# Patient Record
Sex: Female | Born: 1970 | Race: Black or African American | Hispanic: No | Marital: Married | Smoking: Former smoker
Health system: Southern US, Community
[De-identification: ages and names within clinical notes are randomized; demographics above are authoritative.]

## PROBLEM LIST (undated history)

## (undated) DIAGNOSIS — I10 Essential (primary) hypertension: Secondary | ICD-10-CM

---

## 2002-04-07 ENCOUNTER — Emergency Department (HOSPITAL_COMMUNITY): Admission: EM | Admit: 2002-04-07 | Discharge: 2002-04-07 | Payer: Self-pay | Admitting: Emergency Medicine

## 2002-04-07 ENCOUNTER — Encounter: Payer: Self-pay | Admitting: *Deleted

## 2002-05-03 ENCOUNTER — Emergency Department (HOSPITAL_COMMUNITY): Admission: EM | Admit: 2002-05-03 | Discharge: 2002-05-03 | Payer: Self-pay | Admitting: *Deleted

## 2002-05-03 ENCOUNTER — Encounter: Payer: Self-pay | Admitting: *Deleted

## 2007-04-14 ENCOUNTER — Other Ambulatory Visit: Admission: RE | Admit: 2007-04-14 | Discharge: 2007-04-14 | Payer: Self-pay | Admitting: Obstetrics and Gynecology

## 2007-09-12 ENCOUNTER — Encounter (INDEPENDENT_AMBULATORY_CARE_PROVIDER_SITE_OTHER): Payer: Self-pay | Admitting: Gynecology

## 2007-09-12 ENCOUNTER — Ambulatory Visit: Payer: Self-pay | Admitting: Gynecology

## 2007-09-12 ENCOUNTER — Inpatient Hospital Stay (HOSPITAL_COMMUNITY): Admission: AD | Admit: 2007-09-12 | Discharge: 2007-09-14 | Payer: Self-pay | Admitting: Gynecology

## 2010-07-02 NOTE — Op Note (Signed)
Terry [name], Terry [name]              ACCOUNT NO.:  [id number]   MEDICAL RECORD NO.:  [id number]          PATIENT TYPE:  INP   LOCATION:  9123                          FACILITY:  [facility]   PHYSICIAN:  Ginger Carne, MD  DATE OF BIRTH:  [birth year]   DATE OF PROCEDURE:  DATE OF DISCHARGE:                               OPERATIVE REPORT   PREOPERATIVE DIAGNOSES:  1. Repeat cesarean section.  2. Premature rupture of membranes at 37-4/7th weeks' gestational age.  3. Sterilization.   POSTOPERATIVE DIAGNOSES:  1. Repeat cesarean section.  2. Premature rupture of membranes at 37-4/7th weeks' gestational age.  3. Sterilization.  4. Term viable delivery of female infant.   PROCEDURE:  Repeat low-transverse cesarean section and Pomeroy bilateral  tubal ligation.   SURGEON:  Ginger Carne, MD   ASSISTANT:  None.   COMPLICATIONS:  None immediate.   ESTIMATED BLOOD LOSS:  750 mL.   ANESTHESIA:  Spinal.   SPECIMEN:  Cord blood.   OPERATIVE FINDINGS:  Term infant female delivered in a vertex  presentation.  Apgars and weight per delivery room record.  No gross  abnormalities.  Baby cried spontaneously at delivery.  Amniotic fluid  was clear, non-foul smelling.  Uterus, tubes, and ovaries showed normal  decidual changes of pregnancy.  Three-vessel cord central insertion,  complete placenta. Apgar and weight per delivery room record.   OPERATIVE PROCEDURE:  The patient was prepped and draped in usual  fashion and placed in the left lateral supine position.  Betadine  solution used for antiseptic and the patient was catheterized prior to  procedure.  After adequate spinal analgesia, a Pfannenstiel incision was  made and the abdomen opened.  Lower uterine segment incised transversely  after developing the bladder flap.  Baby delivered, cord clamped and  cut, and infant given to the pediatric staff after bulb suctioning.  Placenta removed manually.  Uterus inspected.  Closure of the  uterine  musculature in 1 layer with 2-0 Vicryl running interlocking suture.  Bleeding points hemostatically checked.  Blood clots removed.   Pomeroy bilateral tubal ligation performed by grasping the isthmus  ampullary portion of the tube on either side.  Both tubes were  identified and separated apart from their respective round ligaments.  Approximately 2-3 cm of tube were tied between two 2-0 plain catgut  sutures to form a loop on either side.  Tubes cut above said knots and  tips cauterized.  No active bleeding noted.  Bleeding points  hemostatically checked.  Blood clots removed.  Closure of the fascia  with double-loop 0-PDS running suture, 0-Vicryl for the subcutaneous  layer, and skin staples for the skin.  Instruments and sponge counts  were correct.  The patient tolerated the procedure well and returned to  post anesthesia recovery room in excellent condition.      Ginger Carne, MD  Electronically Signed     SHB/MEDQ  D:  09/12/2007  T:  09/12/2007  Job:  [id number]

## 2010-07-02 NOTE — Discharge Summary (Signed)
[name], [name]              ACCOUNT NO.:  [id number]   MEDICAL RECORD NO.:  [id number]          PATIENT TYPE:  INP   LOCATION:  9123                          FACILITY:  [facility]   PHYSICIAN:  Ginger Carne, MD  DATE OF BIRTH:  01/29/1971   DATE OF ADMISSION:  09/12/2007  DATE OF DISCHARGE:                               DISCHARGE SUMMARY   REASON FOR ADMISSION:  Pregnancy at 37 weeks and 4 days and labor for a  repeat cesarean section and bilateral tubal ligation.   DISCHARGE DIAGNOSES:  Repeat low transverse cesarean section and  bilateral Pomeroy tubal ligation.   HOSPITAL COURSE:  The patient's hospital course has been uneventful.  She has been up, ambulating well, taking p.o. fluids and solids without  difficulties and desires discharge home on postop day #2.   LABORATORY DATA:  Hemoglobin is 10.3 today, hematocrit 30.1, and  platelets are 178.   DISCHARGE MEDICATIONS:  1. Motrin 600 one p.o. q.6 h. p.r.n. cramping.  2. Percocet 5/325 one p.o. q.4 h. p.r.n. pain.  3. HCTZ 25 mg p.o. daily x3.  4. Chromagen Forte 1 p.o. b.i.d.   PHYSICAL EXAM:  Today, vital signs are stable.  Heart has regular rhythm  and rate.  Lungs are clear to auscultation bilaterally.  Abdomen is  soft.  Bowel sounds are present x4.  Incision is dry and intact.  No  redness, swelling, or discharge.  Lochia is scant amount.  There is 1+  pitting edema in the lower extremities.   IMPRESSION:  Stable postoperative day #2.   PLAN:  We are going to discharge her home to follow up on Thursday at  [facility] for staple removal.  She is to notify [facility] if any  problems or she can report back here to the emergency room.      Zerita Boers, N.M.      Ginger Carne, MD  Electronically Signed   DL/MEDQ  D:  04/54/0981  T:  09/14/2007  Job:  [id number]   cc:   Tilda Burrow, M.D.  Fax: [phone]

## 2010-11-15 LAB — CBC
HCT: 30.1 — ABNORMAL LOW
HCT: 38.8
Hemoglobin: 10.3 — ABNORMAL LOW
Hemoglobin: 13.1
MCHC: 33.7
MCV: 95.7
Platelets: 217
RBC: 3.15 — ABNORMAL LOW
RBC: 4.06
RDW: 14.5
RDW: 14.7
WBC: 8.2

## 2010-11-15 LAB — BASIC METABOLIC PANEL
BUN: 3 — ABNORMAL LOW
CO2: 24
Calcium: 8.1 — ABNORMAL LOW
Chloride: 107
Creatinine, Ser: 0.42
GFR calc Af Amer: >60
GFR calc non Af Amer: >60
Glucose, Bld: 103 — ABNORMAL HIGH
Potassium: 3.4 — ABNORMAL LOW
Sodium: 138

## 2010-11-15 LAB — RPR: RPR Ser Ql: NONREACTIVE

## 2012-02-18 ENCOUNTER — Encounter (HOSPITAL_COMMUNITY): Payer: Self-pay

## 2012-02-18 ENCOUNTER — Emergency Department (HOSPITAL_COMMUNITY)
Admission: EM | Admit: 2012-02-18 | Discharge: 2012-02-18 | Disposition: A | Discharge status: Home / Self Care | Payer: BC Managed Care – PPO | Attending: Emergency Medicine | Admitting: Emergency Medicine

## 2012-02-18 DIAGNOSIS — IMO0001 Reserved for inherently not codable concepts without codable children: Secondary | ICD-10-CM | POA: Insufficient documentation

## 2012-02-18 DIAGNOSIS — F17200 Nicotine dependence, unspecified, uncomplicated: Secondary | ICD-10-CM | POA: Insufficient documentation

## 2012-02-18 DIAGNOSIS — I10 Essential (primary) hypertension: Secondary | ICD-10-CM | POA: Insufficient documentation

## 2012-02-18 DIAGNOSIS — R6883 Chills (without fever): Secondary | ICD-10-CM | POA: Insufficient documentation

## 2012-02-18 DIAGNOSIS — J111 Influenza due to unidentified influenza virus with other respiratory manifestations: Secondary | ICD-10-CM | POA: Insufficient documentation

## 2012-02-18 HISTORY — DX: Essential (primary) hypertension: I10

## 2012-02-18 MED ORDER — OSELTAMIVIR PHOSPHATE 75 MG PO CAPS: 75.0000 mg | ORAL_CAPSULE | Freq: Two times a day (BID) | ORAL | Status: DC

## 2012-02-18 NOTE — ED Notes (Signed)
Patient with no complaints at this time. Respirations even and unlabored. Skin warm/dry. Discharge instructions reviewed with patient at this time. Patient given opportunity to voice concerns/ask questions. Patient discharged at this time and left Emergency Department with steady gait.   

## 2012-02-18 NOTE — ED Notes (Signed)
Pt reports cough and congestion since yesterday.

## 2012-02-18 NOTE — ED Provider Notes (Signed)
History     CSN: [id number]  Arrival date & time 02/18/12  1151   First MD Initiated Contact with Patient 02/18/12 1217      Chief Complaint  Patient presents with  . Cough    (Consider location/radiation/quality/duration/timing/severity/associated sxs/prior treatment) HPI Comments: Close family member dx with H1N1 flu 2 days ago.  Patient is a 42 y.o. female presenting with cough. The history is provided by the patient. No language interpreter was used.  Cough This is a new problem. The current episode started yesterday. The problem occurs every few minutes. The problem has not changed since onset.The cough is productive of sputum. There has been no fever. Associated symptoms include chills and myalgias. Pertinent negatives include no headaches, no sore throat, no shortness of breath and no wheezing. She has tried nothing for the symptoms. She is not a smoker. Her past medical history does not include bronchitis, pneumonia, COPD, emphysema or asthma.    Past Medical History  Diagnosis Date  . Hypertension     Past Surgical History  Procedure Date  . Cesarean section     No family history on file.  History  Substance Use Topics  . Smoking status: Current Every Day Smoker  . Smokeless tobacco: Not on file  . Alcohol Use: No    OB History    Grav Para Term Preterm Abortions TAB SAB Ect Mult Living                  Review of Systems  Constitutional: Positive for chills. Negative for fever.  HENT: Negative for sore throat.   Respiratory: Positive for cough. Negative for shortness of breath and wheezing.   Musculoskeletal: Positive for myalgias.  Neurological: Negative for headaches.    Allergies  Review of patient's allergies indicates no known allergies.  Home Medications   Current Outpatient Rx  Name  Route  Sig  Dispense  Refill  . OSELTAMIVIR PHOSPHATE 75 MG PO CAPS   Oral   Take 1 capsule (75 mg total) by mouth every 12 (twelve) hours.   10 capsule  0     BP 192/93  Pulse 83  Temp 97.9 F (36.6 C) (Oral)  Resp 20  Ht 5\' 2"  (1.575 m)  Wt 215 lb (97.523 kg)  BMI 39.32 kg/m2  SpO2 97%  Physical Exam  Nursing note and vitals reviewed. Constitutional: She is oriented to person, place, and time. She appears well-developed and well-nourished. No distress.  HENT:  Head: Normocephalic and atraumatic.  Eyes: EOM are normal.  Neck: Normal range of motion.  Cardiovascular: Normal rate and regular rhythm.   Pulmonary/Chest: Effort normal and breath sounds normal. No accessory muscle usage. Not tachypneic. No respiratory distress. She has no decreased breath sounds. She has no wheezes. She has no rhonchi. She has no rales. She exhibits no tenderness.  Abdominal: Soft. She exhibits no distension. There is no tenderness.  Musculoskeletal: Normal range of motion.  Neurological: She is alert and oriented to person, place, and time.  Skin: Skin is warm and dry.  Psychiatric: She has a normal mood and affect. Judgment normal.    ED Course  Procedures (including critical care time)  Labs Reviewed - No data to display No results found.   1. Influenza       MDM  rx-tamiflu 75 mg, 10 Tylenol or ibuprofen Fluids, rest F/u with PCP prn        Evalina Field, PA 02/18/12 1316  Evalina Field, [place] 02/24/12 2242

## 2012-02-25 NOTE — ED Provider Notes (Signed)
Medical screening examination/treatment/procedure(s) were performed by non-physician practitioner and as supervising physician I was immediately available for consultation/collaboration.   Shelda Jakes, MD 02/25/12 2352

## 2013-09-02 ENCOUNTER — Encounter (HOSPITAL_COMMUNITY): Payer: Self-pay | Admitting: Emergency Medicine

## 2013-09-02 ENCOUNTER — Emergency Department (HOSPITAL_COMMUNITY)
Admission: EM | Admit: 2013-09-02 | Discharge: 2013-09-02 | Disposition: A | Discharge status: Home / Self Care | Payer: BC Managed Care – PPO | Attending: Emergency Medicine | Admitting: Emergency Medicine

## 2013-09-02 DIAGNOSIS — I10 Essential (primary) hypertension: Secondary | ICD-10-CM | POA: Insufficient documentation

## 2013-09-02 DIAGNOSIS — S161XXA Strain of muscle, fascia and tendon at neck level, initial encounter: Secondary | ICD-10-CM

## 2013-09-02 DIAGNOSIS — Z79899 Other long term (current) drug therapy: Secondary | ICD-10-CM | POA: Insufficient documentation

## 2013-09-02 DIAGNOSIS — Y92410 Unspecified street and highway as the place of occurrence of the external cause: Secondary | ICD-10-CM | POA: Insufficient documentation

## 2013-09-02 DIAGNOSIS — IMO0002 Reserved for concepts with insufficient information to code with codable children: Secondary | ICD-10-CM | POA: Insufficient documentation

## 2013-09-02 DIAGNOSIS — S139XXA Sprain of joints and ligaments of unspecified parts of neck, initial encounter: Secondary | ICD-10-CM | POA: Insufficient documentation

## 2013-09-02 DIAGNOSIS — F17200 Nicotine dependence, unspecified, uncomplicated: Secondary | ICD-10-CM | POA: Insufficient documentation

## 2013-09-02 DIAGNOSIS — Y9389 Activity, other specified: Secondary | ICD-10-CM | POA: Insufficient documentation

## 2013-09-02 MED ORDER — NAPROXEN 500 MG PO TABS: 500.0000 mg | ORAL_TABLET | Freq: Two times a day (BID) | ORAL | Status: DC

## 2013-09-02 MED ORDER — METHOCARBAMOL 500 MG PO TABS: 500.0000 mg | ORAL_TABLET | Freq: Two times a day (BID) | ORAL | Status: DC | PRN

## 2013-09-02 MED ORDER — NAPROXEN 250 MG PO TABS
500.0000 mg | ORAL_TABLET | Freq: Once | ORAL | Status: AC
  Administered 2013-09-02: 500 mg via ORAL
  Filled 2013-09-02: qty 2
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
## 2014-05-14 ENCOUNTER — Emergency Department (HOSPITAL_COMMUNITY)
Admission: EM | Admit: 2014-05-14 | Discharge: 2014-05-14 | Disposition: A | Discharge status: Home / Self Care | Payer: BLUE CROSS/BLUE SHIELD | Attending: Emergency Medicine | Admitting: Emergency Medicine

## 2014-05-14 ENCOUNTER — Encounter (HOSPITAL_COMMUNITY): Payer: Self-pay | Admitting: Emergency Medicine

## 2014-05-14 DIAGNOSIS — J029 Acute pharyngitis, unspecified: Secondary | ICD-10-CM | POA: Diagnosis not present

## 2014-05-14 DIAGNOSIS — Z791 Long term (current) use of non-steroidal anti-inflammatories (NSAID): Secondary | ICD-10-CM | POA: Diagnosis not present

## 2014-05-14 DIAGNOSIS — I10 Essential (primary) hypertension: Secondary | ICD-10-CM | POA: Insufficient documentation

## 2014-05-14 DIAGNOSIS — Z720 Tobacco use: Secondary | ICD-10-CM | POA: Diagnosis not present

## 2014-05-14 DIAGNOSIS — Z79899 Other long term (current) drug therapy: Secondary | ICD-10-CM | POA: Insufficient documentation

## 2014-05-14 MED ORDER — DEXAMETHASONE SODIUM PHOSPHATE 4 MG/ML IJ SOLN
10.0000 mg | Freq: Once | INTRAMUSCULAR | Status: AC
  Administered 2014-05-14: 10 mg via INTRAMUSCULAR

## 2014-05-14 MED ORDER — PENICILLIN G BENZATHINE 1200000 UNIT/2ML IM SUSP
1.2000 10*6.[IU] | Freq: Once | INTRAMUSCULAR | Status: AC
  Administered 2014-05-14: 1.2 10*6.[IU] via INTRAMUSCULAR
  Filled 2014-05-14: qty 2

## 2014-05-14 MED ORDER — DEXAMETHASONE SODIUM PHOSPHATE 4 MG/ML IJ SOLN
10.0000 mg | Freq: Once | INTRAMUSCULAR | Status: DC
  Filled 2014-05-14: qty 3

## 2014-05-14 NOTE — ED Provider Notes (Signed)
CSN: [id number]     Arrival date & time 05/14/14  1636 History  This chart was scribed for [facility], NP, working with Benjiman Core, MD by Leona Carry, ED Scribe. The patient was seen in APFT22/APFT22. The patient's care was started at 5:51 PM.     Chief Complaint  Patient presents with  . Sore Throat   Patient is a 44 y.o. female presenting with pharyngitis. The history is provided by the patient. No language interpreter was used.  Sore Throat This is a new problem. Episode onset: 3 days ago. The problem occurs constantly. The problem has not changed since onset.She has tried nothing for the symptoms.   HPI Comments: Terry [name] is a 44 y.o. female who presents to the Emergency Department complaining of a sore throat beginning three days ago when the patient was at work. She states that she has had trouble eating due to the severity of the pain. Patient also complains of generalized fatigue, congestion, cough, and myalgias. She reports that her symptoms are unchanged since beginning Thursday night. She denies fever. She reports that her son currently has strep throat. Patient has no known allergies.  PCP is Dr. Janna Arch.  Past Medical History  Diagnosis Date  . Hypertension    Past Surgical History  Procedure Laterality Date  . Cesarean section     History reviewed. No pertinent family history. History  Substance Use Topics  . Smoking status: Current Every Day Smoker  . Smokeless tobacco: Not on file  . Alcohol Use: No   OB History    No data available     Review of Systems    Allergies  Review of patient's allergies indicates no known allergies.  Home Medications   Prior to Admission medications   Medication Sig Start Date End Date Taking? Authorizing Provider  lisinopril (PRINIVIL,ZESTRIL) 20 MG tablet Take 20 mg by mouth daily.    Historical Provider, MD  methocarbamol (ROBAXIN) 500 MG tablet Take 1 tablet (500 mg total) by mouth 2 (two) times daily  as needed for muscle spasms. 09/02/13   Eber Hong, MD  naproxen (NAPROSYN) 500 MG tablet Take 1 tablet (500 mg total) by mouth 2 (two) times daily with a meal. 09/02/13   Eber Hong, MD  oseltamivir (TAMIFLU) 75 MG capsule Take 1 capsule (75 mg total) by mouth every 12 (twelve) hours. 02/18/12   Richard Paul Half, PA-C   Triage Vitals: BP 131/85 mmHg  Pulse 105  Temp(Src) 99.3 F (37.4 C) (Oral)  Resp 18  Ht  (1.549 m)  Wt 197 lb (89.359 kg)  BMI 37.24 kg/m2  SpO2 100%  LMP 04/28/2014 Physical Exam  Constitutional: She is oriented to person, place, and time. She appears well-developed and well-nourished. No distress.  HENT:  Head: Normocephalic and atraumatic.  Mouth/Throat: Uvula is midline. Uvula swelling present. Oropharyngeal exudate present.  Beefy, red posterior pharynx with exudate. No tonsillar abscess, airway patent.   Eyes: Conjunctivae and EOM are normal.  Neck: Neck supple. No tracheal deviation present.  Swollen lymph nodes.  Cardiovascular: Normal rate.   Pulmonary/Chest: Effort normal. No respiratory distress.  Musculoskeletal: Normal range of motion.  Lymphadenopathy:    She has cervical adenopathy.  Neurological: She is alert and oriented to person, place, and time.  Skin: Skin is warm and dry.  Psychiatric: She has a normal mood and affect. Her behavior is normal.  Nursing note and vitals reviewed.   ED Course  Procedures (including critical care  time) DIAGNOSTIC STUDIES: Oxygen Saturation is 100% on room air, normal by my interpretation.   Patient request injection rather than medication PO.   Decadron 10 mg. IM, Bicillin LA 1.2 M/U IM   MDM  44 y.o. female with sore throat and son dx today with strep throat. Will treat for strep and patient stable for d/c without tonsillar abscess. Swallowing without difficulty.   Final diagnoses:  Exudative pharyngitis   I personally performed the services described in this documentation, which was scribed in  my presence. The recorded information has been reviewed and is accurate.    PeckHope M Neese, NP 05/14/14 1806  Benjiman CoreNathan Pickering, MD 05/24/14 2207

## 2014-05-14 NOTE — Discharge Instructions (Signed)
Use Chloraseptic spray and salt water gargles. Follow up with your doctor or return here if symptoms worsen.

## 2014-05-14 NOTE — ED Notes (Signed)
Patient complaining of sore throat since Thursday and generalized fatigue.

## 2015-02-16 ENCOUNTER — Other Ambulatory Visit: Payer: Self-pay | Admitting: Surgical Oncology

## 2015-02-16 DIAGNOSIS — I10 Essential (primary) hypertension: Secondary | ICD-10-CM

## 2015-02-28 ENCOUNTER — Ambulatory Visit
Admission: RE | Admit: 2015-02-28 | Discharge: 2015-02-28 | Disposition: A | Discharge status: Home / Self Care | Payer: BLUE CROSS/BLUE SHIELD | Source: Ambulatory Visit | Attending: Surgical Oncology | Admitting: Surgical Oncology

## 2015-02-28 DIAGNOSIS — I10 Essential (primary) hypertension: Secondary | ICD-10-CM

## 2015-06-20 ENCOUNTER — Other Ambulatory Visit (HOSPITAL_COMMUNITY)
Admission: RE | Admit: 2015-06-20 | Discharge: 2015-06-20 | Disposition: A | Discharge status: Home / Self Care | Payer: BLUE CROSS/BLUE SHIELD | Source: Ambulatory Visit | Attending: Advanced Practice Midwife | Admitting: Advanced Practice Midwife

## 2015-06-20 ENCOUNTER — Encounter: Payer: Self-pay | Admitting: Advanced Practice Midwife

## 2015-06-20 ENCOUNTER — Ambulatory Visit (INDEPENDENT_AMBULATORY_CARE_PROVIDER_SITE_OTHER): Payer: BLUE CROSS/BLUE SHIELD | Admitting: Advanced Practice Midwife

## 2015-06-20 VITALS — BP 150/100 | HR 88 | Ht 61.0 in | Wt 252.0 lb

## 2015-06-20 DIAGNOSIS — Z01419 Encounter for gynecological examination (general) (routine) without abnormal findings: Secondary | ICD-10-CM

## 2015-06-20 DIAGNOSIS — Z01411 Encounter for gynecological examination (general) (routine) with abnormal findings: Secondary | ICD-10-CM | POA: Insufficient documentation

## 2015-06-20 DIAGNOSIS — Z1151 Encounter for screening for human papillomavirus (HPV): Secondary | ICD-10-CM | POA: Diagnosis present

## 2015-06-20 NOTE — Progress Notes (Signed)
Terry [name] 45 y.o.  Filed Vitals:   06/20/15 1349  BP: 150/100  Pulse: 88     Filed Weights   06/20/15 1349  Weight: 252 lb (114.306 kg)    Past Medical History: Past Medical History  Diagnosis Date  . Hypertension     Past Surgical History: Past Surgical History  Procedure Laterality Date  . Cesarean section      Family History: Family History  Problem Relation Age of Onset  . Hypertension Mother     Social History: Social History  Substance Use Topics  . Smoking status: Former Games developermoker  . Smokeless tobacco: Never Used  . Alcohol Use: No    Allergies: No Known Allergies    Current outpatient prescriptions:  .  lisinopril (PRINIVIL,ZESTRIL) 20 MG tablet, Take 20 mg by mouth daily., Disp: , Rfl:   History of Present Illness: here for pap. Last pap ~ 5 years ago. Has never had an abnormal one.  Plans gastric sleeve soon.  That MD has done screening labs, al normal per pt.  Has mammogram planned  Review of Systems   Patient denies any headaches, blurred vision, shortness of breath, chest pain, abdominal pain, problems with bowel movements, urination, or intercourse.   Physical Exam: General:  Well developed, well nourished, no acute distress Skin:  Warm and dry Neck:  Midline trachea, normal thyroid Lungs; Clear to auscultation bilaterally Breast:  No dominant palpable mass, retraction, or nipple discharge Cardiovascular: Regular rate and rhythm Abdomen:  Soft, non tender, no hepatosplenomegaly Pelvic:  External genitalia is normal in appearance.  The vagina is normal in appearance.  The cervix is bulbous.  Uterus is felt to be normal size, shape, and contour.  No adnexal masses or tenderness noted.  Exam limited by habitus Extremities:  No swelling or varicosities noted Psych:  No mood changes.     Impression: normal GYN exam     Plan: if normal, pap q 3years.  Yearly mammogram        \

## 2015-06-21 LAB — CYTOLOGY - PAP

## 2015-06-25 ENCOUNTER — Other Ambulatory Visit (HOSPITAL_COMMUNITY): Payer: Self-pay | Admitting: Surgical Oncology

## 2015-06-25 DIAGNOSIS — Z1231 Encounter for screening mammogram for malignant neoplasm of breast: Secondary | ICD-10-CM

## 2015-06-27 ENCOUNTER — Encounter: Payer: Self-pay | Admitting: Advanced Practice Midwife

## 2015-06-27 ENCOUNTER — Other Ambulatory Visit: Payer: Self-pay | Admitting: Advanced Practice Midwife

## 2015-06-27 ENCOUNTER — Ambulatory Visit (HOSPITAL_COMMUNITY)
Admission: RE | Admit: 2015-06-27 | Discharge: 2015-06-27 | Disposition: A | Discharge status: Home / Self Care | Payer: BLUE CROSS/BLUE SHIELD | Source: Ambulatory Visit | Attending: Surgical Oncology | Admitting: Surgical Oncology

## 2015-06-27 DIAGNOSIS — R928 Other abnormal and inconclusive findings on diagnostic imaging of breast: Secondary | ICD-10-CM | POA: Insufficient documentation

## 2015-06-27 DIAGNOSIS — Z1231 Encounter for screening mammogram for malignant neoplasm of breast: Secondary | ICD-10-CM | POA: Diagnosis present

## 2015-06-27 DIAGNOSIS — A599 Trichomoniasis, unspecified: Secondary | ICD-10-CM

## 2015-06-27 MED ORDER — METRONIDAZOLE 500 MG PO TABS: 500.0000 mg | ORAL_TABLET | Freq: Two times a day (BID) | ORAL | Status: DC

## 2015-06-28 ENCOUNTER — Other Ambulatory Visit: Payer: Self-pay | Admitting: Surgical Oncology

## 2015-06-28 DIAGNOSIS — R928 Other abnormal and inconclusive findings on diagnostic imaging of breast: Secondary | ICD-10-CM

## 2015-07-11 ENCOUNTER — Ambulatory Visit
Admission: RE | Admit: 2015-07-11 | Discharge: 2015-07-11 | Disposition: A | Discharge status: Home / Self Care | Payer: BLUE CROSS/BLUE SHIELD | Source: Ambulatory Visit | Attending: Surgical Oncology | Admitting: Surgical Oncology

## 2015-07-11 DIAGNOSIS — R928 Other abnormal and inconclusive findings on diagnostic imaging of breast: Secondary | ICD-10-CM

## 2017-05-22 IMAGING — CR DG UGI W/ HIGH DENSITY W/KUB
17 series · 17 of 17 positions shown · non-contrast
Comparison: None.

CLINICAL DATA: Morbid obesity.  Preop for gastric sleeve.

EXAM:
UPPER GI SERIES WITH KUB
TECHNIQUE: After obtaining a scout radiograph a routine upper GI series was
performed using barium.

[run (1 of 16)]
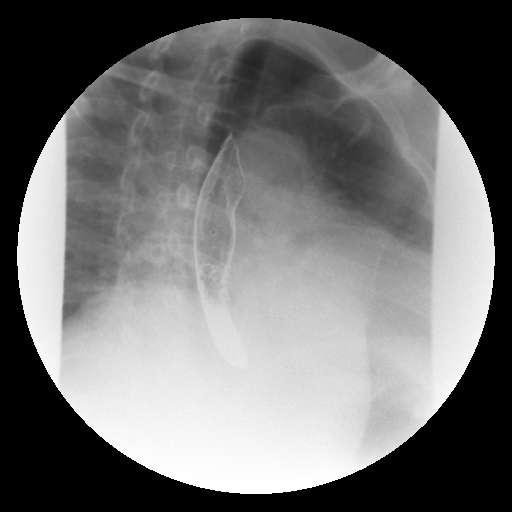

[run (2 of 16)]
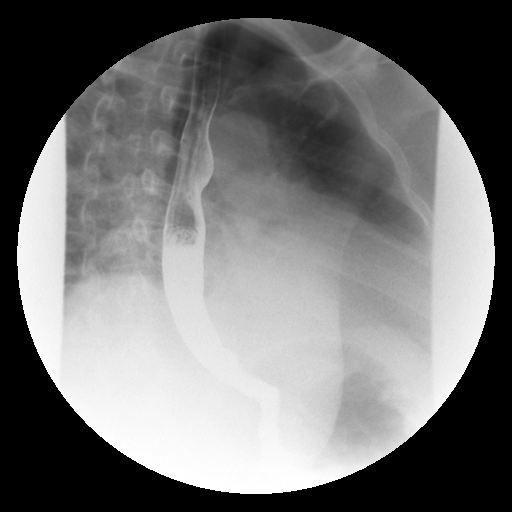

[run (3 of 16)]
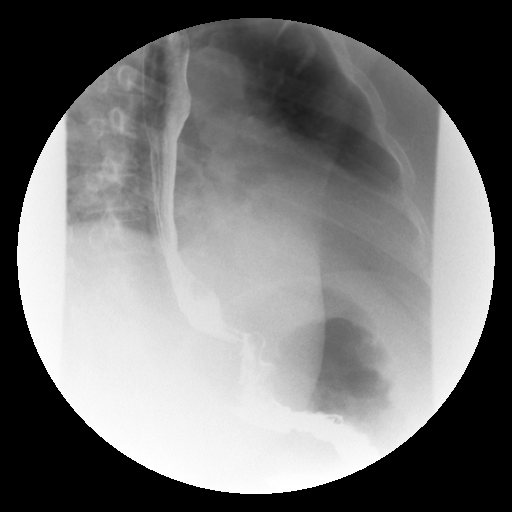

[run (4 of 16)]
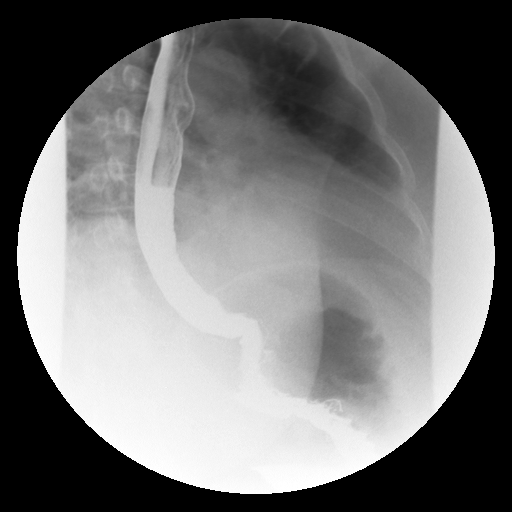

[run (5 of 16)]
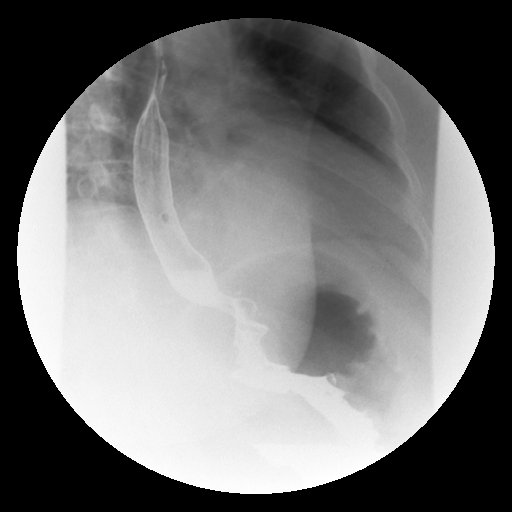

[run (6 of 16)]
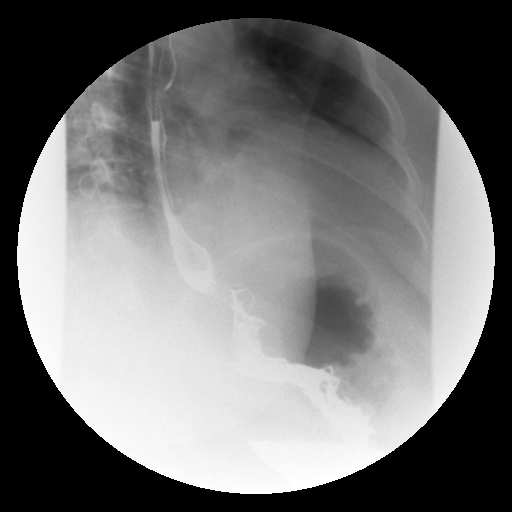

[run (7 of 16)]
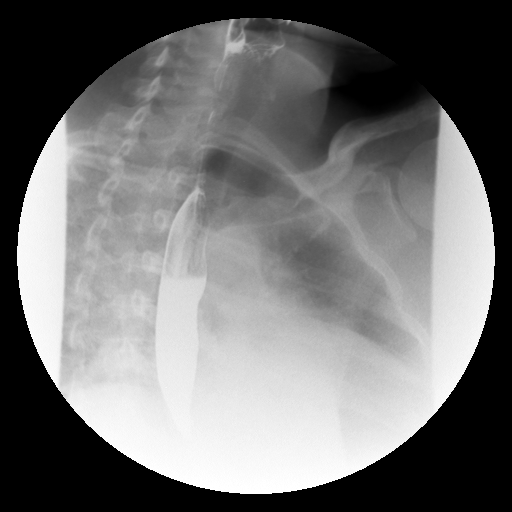

[run (8 of 16)]
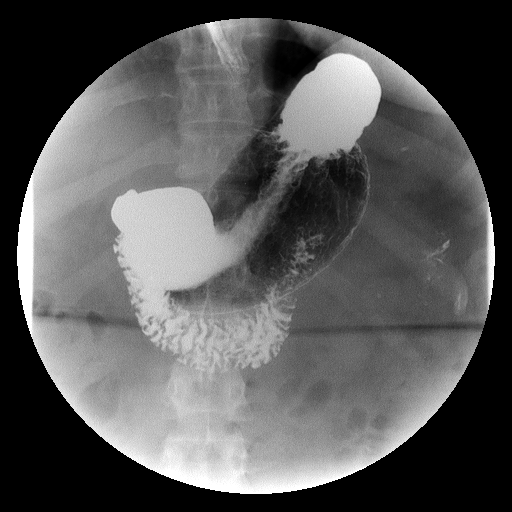

[run (9 of 16)]
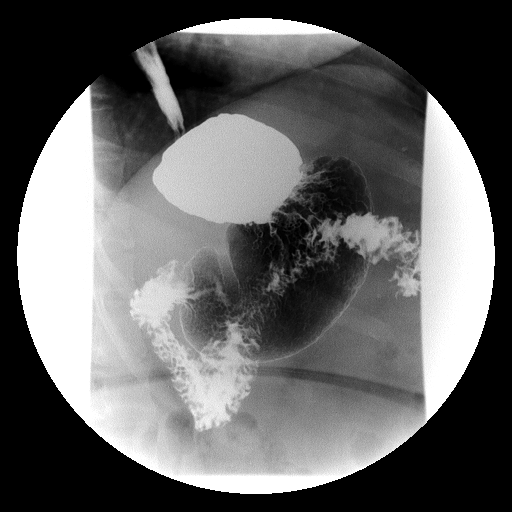

[run (10 of 16)]
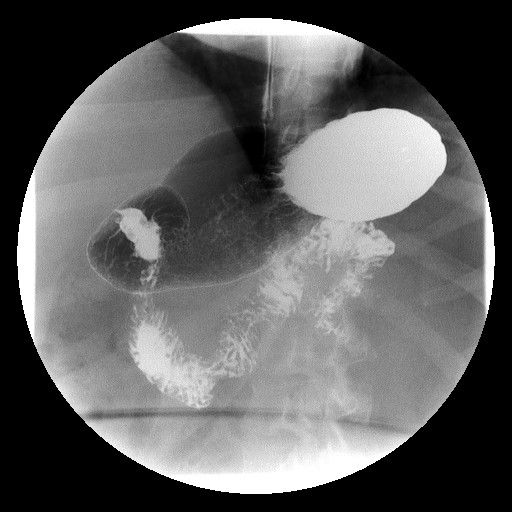

[run (11 of 16)]
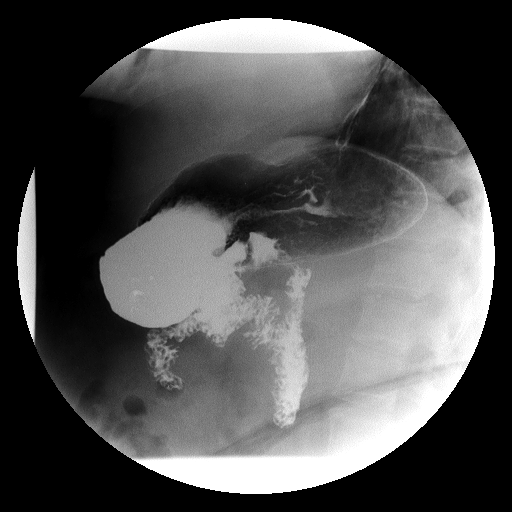

[run (12 of 16)]
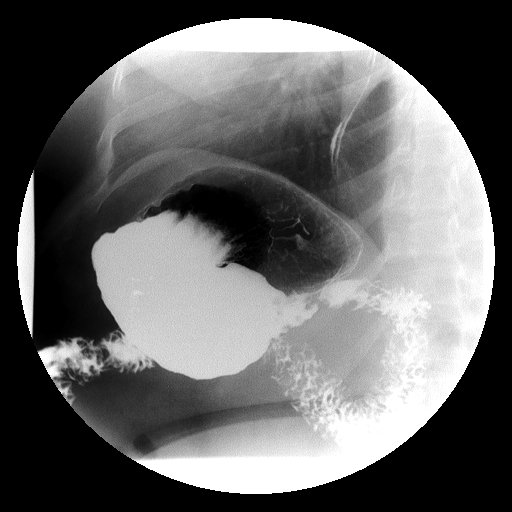

[run (13 of 16)]
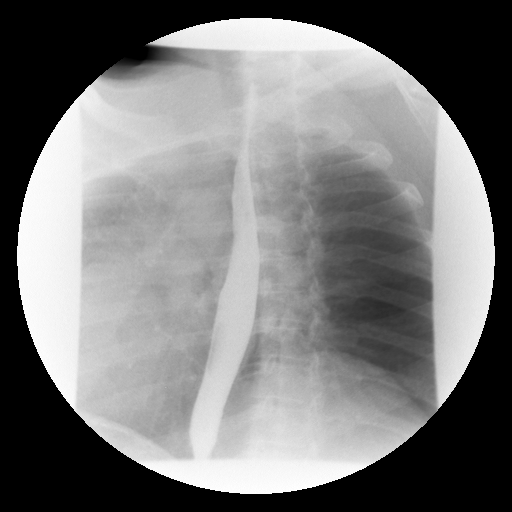

[run (14 of 16)]
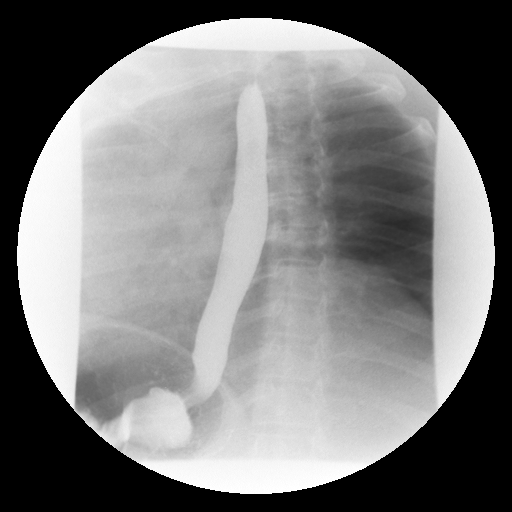

[run (15 of 16)]
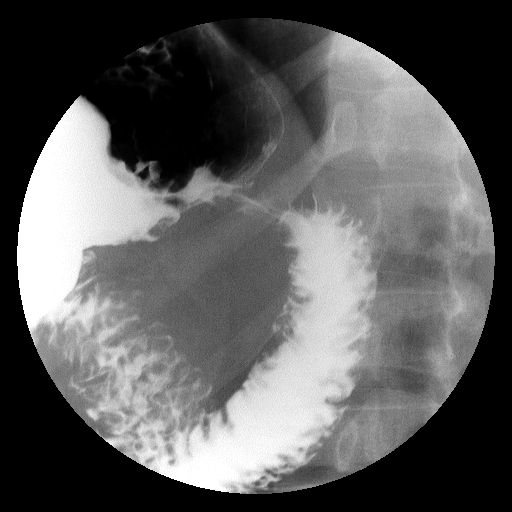

[run (16 of 16)]
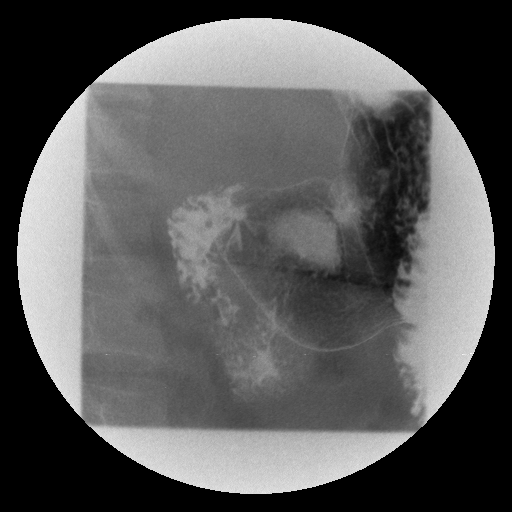

[view not recorded]
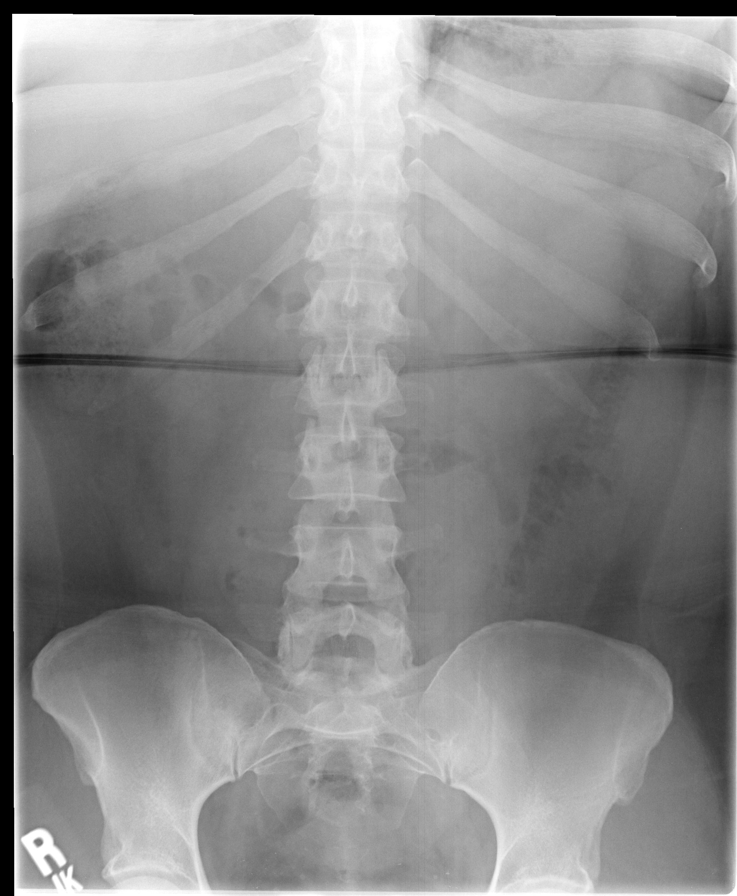

[17 of 17 positions shown; findings below may reference images not displayed]

FLUOROSCOPY TIME:  Radiation Exposure Index (as provided by the
fluoroscopic device): Not available on this device

If the device does not provide the exposure index:

Fluoroscopy Time (in minutes and seconds):  2 minutes 42 seconds

Number of Acquired Images: 17 series, but only 6 of these are
exposures, the others are saved fluoroscopy
FINDINGS: The esophagus has normal distensibility. No evidence of mucosal
lesion or stricture. No hiatal hernia. Occasional gastroesophageal
reflux was encountered, mainly exacerbated by movement. Normal
esophageal motility.

The stomach has normal shape and distensibility. There is no
evidence of mass lesion or ulceration. The bulb was reluctant to
fill with air and in the interest of radiation savings
single-contrast C-loop imaging was considered adequate and was
normal.
IMPRESSION: Other than induced gastroesophageal reflux, normal upper GI.

## 2019-01-04 ENCOUNTER — Other Ambulatory Visit: Payer: Self-pay

## 2019-01-04 DIAGNOSIS — Z20822 Contact with and (suspected) exposure to covid-19: Secondary | ICD-10-CM

## 2019-01-05 LAB — NOVEL CORONAVIRUS, NAA: SARS-CoV-2, NAA: NOT DETECTED

## 2021-05-27 ENCOUNTER — Other Ambulatory Visit: Payer: Self-pay | Admitting: Family Medicine

## 2021-05-27 DIAGNOSIS — Z1231 Encounter for screening mammogram for malignant neoplasm of breast: Secondary | ICD-10-CM

## 2021-12-23 ENCOUNTER — Other Ambulatory Visit: Payer: Self-pay | Admitting: Internal Medicine

## 2021-12-24 LAB — BASIC METABOLIC PANEL WITH GFR
BUN: 14 mg/dL (ref 7–25)
CO2: 23 mmol/L (ref 20–32)
Calcium: 9 mg/dL (ref 8.6–10.4)
Chloride: 105 mmol/L (ref 98–110)
Creat: 0.72 mg/dL (ref 0.50–1.03)
Glucose, Bld: 93 mg/dL (ref 65–99)
Potassium: 3.6 mmol/L (ref 3.5–5.3)
Sodium: 139 mmol/L (ref 135–146)
eGFR: 101 mL/min/{1.73_m2} (ref >=60)

## 2021-12-24 LAB — VITAMIN D 25 HYDROXY (VIT D DEFICIENCY, FRACTURES): Vit D, 25-Hydroxy: 13 ng/mL — ABNORMAL LOW (ref 30–100)

## 2021-12-24 LAB — EXTRA LAV TOP TUBE

## 2022-02-27 ENCOUNTER — Ambulatory Visit: Payer: 59

## 2022-04-11 ENCOUNTER — Ambulatory Visit
Admission: RE | Admit: 2022-04-11 | Discharge: 2022-04-11 | Disposition: A | Discharge status: Home / Self Care | Payer: 59 | Source: Ambulatory Visit | Attending: Family Medicine | Admitting: Family Medicine

## 2022-04-11 DIAGNOSIS — Z1231 Encounter for screening mammogram for malignant neoplasm of breast: Secondary | ICD-10-CM

## 2022-04-23 ENCOUNTER — Other Ambulatory Visit (HOSPITAL_COMMUNITY)
Admission: RE | Admit: 2022-04-23 | Discharge: 2022-04-23 | Disposition: A | Discharge status: Home / Self Care | Payer: 59 | Source: Ambulatory Visit | Attending: Obstetrics and Gynecology | Admitting: Obstetrics and Gynecology

## 2022-04-23 ENCOUNTER — Ambulatory Visit (INDEPENDENT_AMBULATORY_CARE_PROVIDER_SITE_OTHER): Payer: 59 | Admitting: Obstetrics and Gynecology

## 2022-04-23 ENCOUNTER — Encounter: Payer: Self-pay | Admitting: Obstetrics and Gynecology

## 2022-04-23 VITALS — BP 132/75 | HR 73 | Ht 61.5 in | Wt 250.0 lb

## 2022-04-23 DIAGNOSIS — Z01419 Encounter for gynecological examination (general) (routine) without abnormal findings: Secondary | ICD-10-CM | POA: Diagnosis present

## 2022-04-23 NOTE — Progress Notes (Signed)
Terry [name] is a 52 y.o. No obstetric history on file. female here for a routine annual gynecologic exam.  Current complaints: Irregular cycles for the last 2 months and some mild peri/menopausal Sx.    Not sexual active. Requests vaginal STD testing   Gynecologic History No LMP recorded. Contraception: abstinence Last Pap: 2017. Results were: normal Last mammogram: 2/24. Results were: normal  Obstetric History OB History  No obstetric history on file.    Past Medical History:  Diagnosis Date   Hypertension     Past Surgical History:  Procedure Laterality Date   CESAREAN SECTION      Current Outpatient Medications on File Prior to Visit  Medication Sig Dispense Refill   amLODipine (NORVASC) 10 MG tablet Take 10 mg by mouth daily.     MOUNJARO 2.5 MG/0.5ML Pen SMARTSIG:2.5 Milligram(s) SUB-Q Once a Week     valsartan-hydrochlorothiazide (DIOVAN-HCT) 160-25 MG tablet Take 1 tablet by mouth every morning.     Vitamin D, Ergocalciferol, (DRISDOL) 1.25 MG (50000 UNIT) CAPS capsule Take 50,000 Units by mouth once a week.     vitamin E 1000 UNIT capsule Take 1,000 Units by mouth daily.     No current facility-administered medications on file prior to visit.    No Known Allergies  Social History   Socioeconomic History   Marital status: Married    Spouse name: Not on file   Number of children: Not on file   Years of education: Not on file   Highest education level: Not on file  Occupational History   Not on file  Tobacco Use   Smoking status: Former   Smokeless tobacco: Never  Substance and Sexual Activity   Alcohol use: No    Alcohol/week: 0.0 standard drinks of alcohol   Drug use: No   Sexual activity: Not Currently  Other Topics Concern   Not on file  Social History Narrative   Not on file   Social Determinants of Health   Financial Resource Strain: Not on file  Food Insecurity: Not on file  Transportation Needs: Not on file  Physical Activity: Not on  file  Stress: Not on file  Social Connections: Not on file  Intimate Partner Violence: Not on file    Family History  Problem Relation Age of Onset   Hypertension Mother     The following portions of the patient's history were reviewed and updated as appropriate: allergies, current medications, past family history, past medical history, past social history, past surgical history and problem list.  Review of Systems Pertinent items noted in HPI and remainder of comprehensive ROS otherwise negative.   Objective:  BP 132/75 (BP Location: Left Arm, Patient Position: Sitting, Cuff Size: Normal)   Pulse 73   Ht 5' 1.5" (1.562 m)   Wt 250 lb (113.4 kg)   BMI 46.47 kg/m  Chaperone present CONSTITUTIONAL: Well-developed, well-nourished female in no acute distress.  HENT:  Normocephalic, atraumatic, External right and left ear normal. Oropharynx is clear and moist EYES: Conjunctivae and EOM are normal. Pupils are equal, round, and reactive to light. No scleral icterus.  NECK: Normal range of motion, supple, no masses.  Normal thyroid.  SKIN: Skin is warm and dry. No rash noted. Not diaphoretic. No erythema. No pallor. [place]: Alert and oriented to person, place, and time. Normal reflexes, muscle tone coordination. No cranial nerve deficit noted. PSYCHIATRIC: Normal mood and affect. Normal behavior. Normal judgment and thought content. CARDIOVASCULAR: Normal heart rate noted, regular rhythm  RESPIRATORY: Clear to auscultation bilaterally. Effort and breath sounds normal, no problems with respiration noted. BREASTS: Symmetric in size. No masses, skin changes, nipple drainage, or lymphadenopathy. ABDOMEN: Soft, normal bowel sounds, no distention noted.  No tenderness, rebound or guarding.  PELVIC: Normal appearing external genitalia; normal appearing vaginal mucosa and cervix.  No abnormal discharge noted.  Pap smear obtained.  Normal uterine size, no other palpable masses, no uterine or  adnexal tenderness. MUSCULOSKELETAL: Normal range of motion. No tenderness.  No cyanosis, clubbing, or edema.  2+ distal pulses.   Assessment:  Annual gynecologic examination with pap smear   Plan:  Will follow up results of pap smear and manage accordingly. Discussed peri/menopausal Sx with pt. Will follow for now. Encouraged to d/c smoking Routine preventative health maintenance measures emphasized. Please refer to After Visit Summary for other counseling recommendations.    Chancy Milroy, MD, [place] Attending [place] for [facility], [place]

## 2022-04-28 LAB — CYTOLOGY - PAP
Adequacy: ABSENT
Chlamydia: NEGATIVE
Comment: NEGATIVE
Comment: NEGATIVE
Comment: NEGATIVE
Comment: NORMAL
Diagnosis: NEGATIVE
High risk HPV: NEGATIVE
Neisseria Gonorrhea: NEGATIVE
Trichomonas: NEGATIVE

## 2022-06-13 ENCOUNTER — Other Ambulatory Visit (HOSPITAL_COMMUNITY): Payer: Self-pay

## 2022-06-13 ENCOUNTER — Other Ambulatory Visit: Payer: Self-pay

## 2022-06-13 MED ORDER — MOUNJARO 7.5 MG/0.5ML ~~LOC~~ SOAJ
7.5000 mg | SUBCUTANEOUS | 2 refills | Status: AC
  Filled 2022-06-13: qty 2, 28d supply, fill #0

## 2023-06-05 ENCOUNTER — Other Ambulatory Visit (HOSPITAL_COMMUNITY): Payer: Self-pay

## 2023-06-05 MED ORDER — MOUNJARO 10 MG/0.5ML ~~LOC~~ SOAJ
10.0000 mg | SUBCUTANEOUS | 2 refills | Status: AC
  Filled 2023-06-05: qty 2, 28d supply, fill #0
